# Patient Record
Sex: Female | Born: 1965 | Hispanic: Yes | Marital: Married | Smoking: Current some day smoker
Health system: Southern US, Community
[De-identification: ages and names within clinical notes are randomized; demographics above are authoritative.]

## PROBLEM LIST (undated history)

## (undated) DIAGNOSIS — R0789 Other chest pain: Secondary | ICD-10-CM

## (undated) DIAGNOSIS — I10 Essential (primary) hypertension: Secondary | ICD-10-CM

## (undated) DIAGNOSIS — R002 Palpitations: Secondary | ICD-10-CM

## (undated) HISTORY — DX: Other chest pain: R07.89

## (undated) HISTORY — DX: Palpitations: R00.2

## (undated) HISTORY — DX: Essential (primary) hypertension: I10

---

## 2015-07-25 ENCOUNTER — Telehealth: Payer: Self-pay | Admitting: Cardiovascular Disease

## 2015-07-25 NOTE — Telephone Encounter (Signed)
07/25/2015 Received referral packet from [facility] for upcoming appointment on 07/30/2015 with Dr. Allyson SabalBerry.  [facility]

## 2015-07-30 ENCOUNTER — Ambulatory Visit (INDEPENDENT_AMBULATORY_CARE_PROVIDER_SITE_OTHER): Payer: Self-pay | Admitting: Cardiovascular Disease

## 2015-07-30 ENCOUNTER — Encounter: Payer: Self-pay | Admitting: Cardiovascular Disease

## 2015-07-30 ENCOUNTER — Ambulatory Visit (INDEPENDENT_AMBULATORY_CARE_PROVIDER_SITE_OTHER): Payer: PRIVATE HEALTH INSURANCE

## 2015-07-30 DIAGNOSIS — R002 Palpitations: Secondary | ICD-10-CM | POA: Diagnosis not present

## 2015-07-30 DIAGNOSIS — R079 Chest pain, unspecified: Secondary | ICD-10-CM

## 2015-07-30 DIAGNOSIS — I10 Essential (primary) hypertension: Secondary | ICD-10-CM | POA: Insufficient documentation

## 2015-07-30 NOTE — Assessment & Plan Note (Signed)
History of new onset chest pain over the last 3-4 months. She's had 3-4 episodes lasting several minutes at a time. Her last episode was associated with left upper extremity radiation. Her risk factors include hypertension. I'm going to get an exercise Myoview stress test to risk stratify her.

## 2015-07-30 NOTE — Patient Instructions (Addendum)
Medication Instructions:  Your physician recommends that you continue on your current medications as directed. Please refer to the Current Medication list given to you today.   Labwork: none  Testing/Procedures: Your physician has requested that you have en exercise stress myoview. For further information please visit [email or web address]. Please follow instruction sheet, as given.  Your physician has recommended that you wear an event monitor. Event monitors are medical devices that record the heart's electrical activity. Doctors most often us these monitors to diagnose arrhythmias. Arrhythmias are problems with the speed or rhythm of the heartbeat. The monitor is a small, portable device. You can wear one while you do your normal daily activities. This is usually used to diagnose what is causing palpitations/syncope (passing out).   Follow-Up: Your physician recommends that you schedule a follow-up appointment in: 3-4 weeks with Dr. Allyson SabalBerry - after testing and monitor are complete    Any Other Special Instructions Will Be Listed Below (If Applicable).  If you have financial hardship (no insurance, self-pay, assistance card, orange card)..Patient needs to go to [email or web address] >>click patients tab >>click financial assistance >> complete/submit application     If you need a refill on your cardiac medications before your next appointment, please call your pharmacy.

## 2015-07-30 NOTE — Progress Notes (Signed)
     07/30/2015 Mackenzie Frost   07/10/1966  [id number]  Primary Physician Paulino RilyJONES,ENRICO G, MD Primary Cardiologist: Runell GessJonathan J. Ace Bergfeld MD Roseanne RenoFACP,FACC,FAHA, FSCAI   HPI:  Mrs. Mackenzie Frost is a 50 year old mildly overweight married female mother of 2 children and works as a Scientist, physiologicalreceptionist. She was referred by Dr. Knox RoyaltyEnrico Jones for cardiovascular evaluation because of new onset chest pain. She has a history of treated hypertension but otherwise no cardiovascular risk factors. She had the onset of chest pain 3-4 months ago has had several episodes lasting 2-3 minutes a time but recently with left upper extremity radiation. There was an episode that awakened her from sleep. There are no other associated symptoms other than palpitations at the time of chest pain.   Current Outpatient Prescriptions  Medication Sig Dispense Refill  . lisinopril (PRINIVIL,ZESTRIL) 20 MG tablet Take 20 mg by mouth daily.     No current facility-administered medications for this visit.    Allergies  Allergen Reactions  . Sulfate Hives    Social History   Social History  . Marital Status: Married    Spouse Name: N/A  . Number of Children: N/A  . Years of Education: N/A   Occupational History  . Not on file.   Social History Main Topics  . Smoking status: Current Some Day Smoker  . Smokeless tobacco: Not on file  . Alcohol Use: Yes  . Drug Use: No  . Sexual Activity: Not on file   Other Topics Concern  . Not on file   Social History Narrative  . No narrative on file     Review of Systems: General: negative for chills, fever, night sweats or weight changes.  Cardiovascular: negative for chest pain, dyspnea on exertion, edema, orthopnea, palpitations, paroxysmal nocturnal dyspnea or shortness of breath Dermatological: negative for rash Respiratory: negative for cough or wheezing Urologic: negative for hematuria Abdominal: negative for nausea, vomiting, diarrhea, bright red blood per rectum, melena, or  hematemesis Neurologic: negative for visual changes, syncope, or dizziness All other systems reviewed and are otherwise negative except as noted above.    Blood pressure 138/86, pulse 59, height 5\' 2"  (1.575 m), weight 138 lb (62.596 kg).  General appearance: alert and no distress Neck: no adenopathy, no carotid bruit, no JVD, supple, symmetrical, trachea midline and thyroid not enlarged, symmetric, no tenderness/mass/nodules Lungs: clear to auscultation bilaterally Heart: regular rate and rhythm, S1, S2 normal, no murmur, click, rub or gallop Extremities: extremities normal, atraumatic, no cyanosis or edema  EKG sinus bradycardia 59 without ST or T-wave changes. I personally reviewed this EKG  ASSESSMENT AND PLAN:   Palpitations Recent onset of palpitations occurring once or twice a month associated with chest pain. Her TSH was normal. We will check a 2 week event monitor.  Essential hypertension History of hypertension blood pressure measured at 138/86. She is on lisinopril 20 mg a day. Continue current meds are current  Chest pain History of new onset chest pain over the last 3-4 months. She's had 3-4 episodes lasting several minutes at a time. Her last episode was associated with left upper extremity radiation. Her risk factors include hypertension. I'm going to get an exercise Myoview stress test to risk stratify her.      Runell GessJonathan J. Kaimani Clayson MD FACP,FACC,FAHA, [facility] 07/30/2015 11:48 AM

## 2015-07-30 NOTE — Assessment & Plan Note (Signed)
History of hypertension blood pressure measured at 138/86. She is on lisinopril 20 mg a day. Continue current meds are current

## 2015-07-30 NOTE — Assessment & Plan Note (Signed)
Recent onset of palpitations occurring once or twice a month associated with chest pain. Her TSH was normal. We will check a 2 week event monitor.

## 2015-08-01 ENCOUNTER — Telehealth (HOSPITAL_COMMUNITY): Payer: Self-pay

## 2015-08-01 NOTE — Telephone Encounter (Signed)
Encounter complete. 

## 2015-08-05 ENCOUNTER — Ambulatory Visit (HOSPITAL_COMMUNITY)
Admission: RE | Admit: 2015-08-05 | Discharge: 2015-08-05 | Disposition: A | Discharge status: Home / Self Care | Payer: PRIVATE HEALTH INSURANCE | Source: Ambulatory Visit | Attending: Cardiology | Admitting: Cardiology

## 2015-08-05 DIAGNOSIS — R5383 Other fatigue: Secondary | ICD-10-CM | POA: Diagnosis not present

## 2015-08-05 DIAGNOSIS — F17200 Nicotine dependence, unspecified, uncomplicated: Secondary | ICD-10-CM | POA: Diagnosis not present

## 2015-08-05 DIAGNOSIS — R002 Palpitations: Secondary | ICD-10-CM | POA: Diagnosis not present

## 2015-08-05 DIAGNOSIS — I10 Essential (primary) hypertension: Secondary | ICD-10-CM | POA: Diagnosis not present

## 2015-08-05 DIAGNOSIS — R42 Dizziness and giddiness: Secondary | ICD-10-CM | POA: Insufficient documentation

## 2015-08-05 DIAGNOSIS — R079 Chest pain, unspecified: Secondary | ICD-10-CM | POA: Insufficient documentation

## 2015-08-05 DIAGNOSIS — Z8249 Family history of ischemic heart disease and other diseases of the circulatory system: Secondary | ICD-10-CM | POA: Insufficient documentation

## 2015-08-05 LAB — MYOCARDIAL PERFUSION IMAGING
CHL CUP MPHR: 169 {beats}/min
CHL CUP NUCLEAR SDS: 0
CHL CUP NUCLEAR SRS: 5
CHL CUP RESTING HR STRESS: 62 {beats}/min
CHL RATE OF PERCEIVED EXERTION: 17
CSEPED: 9 min
CSEPEDS: 0 s
CSEPEW: 10.1 METS
LV sys vol: 13 mL
LVDIAVOL: 51 mL
Peak HR: 160 {beats}/min
Percent HR: 94 %
SSS: 5
TID: 1.05

## 2015-08-05 MED ORDER — TECHNETIUM TC 99M SESTAMIBI GENERIC - CARDIOLITE
30.4000 | Freq: Once | INTRAVENOUS | Status: AC | PRN
  Administered 2015-08-05: 30.4 via INTRAVENOUS

## 2015-08-05 MED ORDER — TECHNETIUM TC 99M SESTAMIBI GENERIC - CARDIOLITE
10.6000 | Freq: Once | INTRAVENOUS | Status: AC | PRN
  Administered 2015-08-05: 10.6 via INTRAVENOUS

## 2015-08-27 ENCOUNTER — Ambulatory Visit: Payer: Self-pay | Admitting: Cardiovascular Disease

## 2015-09-10 ENCOUNTER — Ambulatory Visit: Payer: Self-pay | Admitting: Cardiovascular Disease

## 2015-10-15 ENCOUNTER — Ambulatory Visit (INDEPENDENT_AMBULATORY_CARE_PROVIDER_SITE_OTHER): Payer: PRIVATE HEALTH INSURANCE | Admitting: Cardiovascular Disease

## 2015-10-15 ENCOUNTER — Encounter: Payer: Self-pay | Admitting: Cardiovascular Disease

## 2015-10-15 VITALS — BP 134/78 | HR 64 | Ht 62.0 in | Wt 143.0 lb

## 2015-10-15 DIAGNOSIS — R002 Palpitations: Secondary | ICD-10-CM | POA: Diagnosis not present

## 2015-10-15 DIAGNOSIS — I10 Essential (primary) hypertension: Secondary | ICD-10-CM | POA: Diagnosis not present

## 2015-10-15 DIAGNOSIS — R079 Chest pain, unspecified: Secondary | ICD-10-CM | POA: Diagnosis not present

## 2015-10-15 NOTE — Patient Instructions (Signed)
Medication Instructions:  Your physician recommends that you continue on your current medications as directed. Please refer to the Current Medication list given to you today.   Labwork: none  Testing/Procedures: none  Follow-Up: Follow up with Dr. Berry as needed.   Any Other Special Instructions Will Be Listed Below (If Applicable).     If you need a refill on your cardiac medications before your next appointment, please call your pharmacy.   

## 2015-10-15 NOTE — Assessment & Plan Note (Signed)
Patient was completely palpitations and had an event monitor which showed only sinus rhythm. She's had no further symptoms.

## 2015-10-15 NOTE — Assessment & Plan Note (Signed)
History of hypertension blood pressure measured at 134/78. She is on lisinopril. Continue current meds at current dosing

## 2015-10-15 NOTE — Assessment & Plan Note (Signed)
The patient returns for follow-up of her outpatient Myoview stress test performed 08/18/15 for dilation of chest pain. Her stress test was low risk and nonischemic with breast attenuation artifact. She has had no further chest pain since I last saw her.

## 2015-10-15 NOTE — Progress Notes (Signed)
Mackenzie Frost returns for follow-up of her outpatient test performed to evaluate atypical chest pain and palpitations. Her Myoview stress test was low risk with rest attenuation artifact, and no ischemia. Her vent monitor showed only sinus rhythm. She's had no recurrent symptoms. I have reassured her regarding the favorable results and the likelihood that her symptoms were not ischemically mediated. I will see her back when necessary.

## 2015-10-27 ENCOUNTER — Other Ambulatory Visit: Payer: Self-pay | Admitting: Occupational Medicine

## 2015-10-27 ENCOUNTER — Ambulatory Visit: Payer: Self-pay

## 2015-10-27 DIAGNOSIS — Z0000 Encounter for general adult medical examination without abnormal findings: Secondary | ICD-10-CM

## 2017-01-29 ENCOUNTER — Emergency Department (HOSPITAL_COMMUNITY): Payer: Managed Care, Other (non HMO)

## 2017-01-29 ENCOUNTER — Encounter (HOSPITAL_COMMUNITY): Payer: Self-pay | Admitting: Emergency Medicine

## 2017-01-29 DIAGNOSIS — I10 Essential (primary) hypertension: Secondary | ICD-10-CM | POA: Diagnosis not present

## 2017-01-29 DIAGNOSIS — F17200 Nicotine dependence, unspecified, uncomplicated: Secondary | ICD-10-CM | POA: Diagnosis not present

## 2017-01-29 DIAGNOSIS — E860 Dehydration: Secondary | ICD-10-CM | POA: Insufficient documentation

## 2017-01-29 DIAGNOSIS — D649 Anemia, unspecified: Secondary | ICD-10-CM | POA: Diagnosis not present

## 2017-01-29 DIAGNOSIS — Z79899 Other long term (current) drug therapy: Secondary | ICD-10-CM | POA: Insufficient documentation

## 2017-01-29 DIAGNOSIS — R002 Palpitations: Secondary | ICD-10-CM | POA: Diagnosis present

## 2017-01-29 LAB — POCT I-STAT TROPONIN I: TROPONIN I, POC: 0 ng/mL (ref 0.00–0.08)

## 2017-01-29 NOTE — ED Triage Notes (Signed)
Patient states she is having chest pain. Chest pain started an hour ago while having intercourse. Patient states it feels like her heart is beating really fast.

## 2017-01-30 ENCOUNTER — Emergency Department (HOSPITAL_COMMUNITY)
Admission: EM | Admit: 2017-01-30 | Discharge: 2017-01-30 | Disposition: A | Discharge status: Home / Self Care | Payer: Managed Care, Other (non HMO) | Attending: Emergency Medicine | Admitting: Emergency Medicine

## 2017-01-30 DIAGNOSIS — E860 Dehydration: Secondary | ICD-10-CM

## 2017-01-30 DIAGNOSIS — D649 Anemia, unspecified: Secondary | ICD-10-CM

## 2017-01-30 LAB — CBC
HEMATOCRIT: 33 % — AB (ref 36.0–46.0)
Hemoglobin: 10.6 g/dL — ABNORMAL LOW (ref 12.0–15.0)
MCH: 25.9 pg — ABNORMAL LOW (ref 26.0–34.0)
MCHC: 32.1 g/dL (ref 30.0–36.0)
MCV: 80.7 fL (ref 78.0–100.0)
PLATELETS: 283 10*3/uL (ref 150–400)
RBC: 4.09 MIL/uL (ref 3.87–5.11)
RDW: 16.4 % — ABNORMAL HIGH (ref 11.5–15.5)
WBC: 7.4 10*3/uL (ref 4.0–10.5)

## 2017-01-30 LAB — POCT I-STAT TROPONIN I: Troponin i, poc: 0 ng/mL (ref 0.00–0.08)

## 2017-01-30 LAB — BASIC METABOLIC PANEL
Anion gap: 10 (ref 5–15)
BUN: 16 mg/dL (ref 6–20)
CHLORIDE: 108 mmol/L (ref 101–111)
CO2: 20 mmol/L — ABNORMAL LOW (ref 22–32)
CREATININE: 0.96 mg/dL (ref 0.44–1.00)
Calcium: 9.2 mg/dL (ref 8.9–10.3)
GFR calc Af Amer: >60 mL/min (ref >60)
GLUCOSE: 125 mg/dL — AB (ref 65–99)
Potassium: 3.5 mmol/L (ref 3.5–5.1)
SODIUM: 138 mmol/L (ref 135–145)

## 2017-01-30 MED ORDER — SODIUM CHLORIDE 0.9 % IV BOLUS (SEPSIS)
1000.0000 mL | Freq: Once | INTRAVENOUS | Status: AC
  Administered 2017-01-30: 1000 mL via INTRAVENOUS

## 2017-01-30 MED ORDER — FERROUS SULFATE 325 (65 FE) MG PO TABS: 325.0000 mg | ORAL_TABLET | Freq: Two times a day (BID) | ORAL | 0 refills | Status: DC

## 2017-01-30 NOTE — ED Provider Notes (Signed)
[facility]-EMERGENCY DEPT Provider Note   CSN: [id number] Arrival date & time: 01/29/17  2309     History   Chief Complaint Chief Complaint  Patient presents with  . Chest Pain    HPI Mackenzie Frost is a 51 y.o. female.  HPI 51 yo F with h/o palpitations here with palpitations, mild SOB. Pt states that she was having intercourse with her husband today when she began to feel like her heart was beating "really fast." It was regular but fast. She also felt more tired/SOB than usual. She admits to similar sx in the past and has worn a holter monitor, but was not told anything was abnormal. She does also admit to mild recent DOE, general fatigue, and feeling cold all the time. No vaginal bleeding. No dark red or black bowel movements. No ongoing palpitations, CP, or other sx. No current SOB. No syncope or lightheadedness.  Past Medical History:  Diagnosis Date  . Atypical chest pain   . Hypertension   . Palpitations     Patient Active Problem List   Diagnosis Date Noted  . Chest pain 07/30/2015  . Palpitations 07/30/2015  . Essential hypertension 07/30/2015    History reviewed. No pertinent surgical history.  OB History    No data available       Home Medications    Prior to Admission medications   Medication Sig Start Date End Date Taking? Authorizing Provider  loratadine (CLARITIN) 10 MG tablet Take 10 mg by mouth daily.   Yes [provider]  losartan (COZAAR) 50 MG tablet Take 50 mg by mouth daily.   Yes [provider]  ferrous sulfate 325 (65 FE) MG tablet Take 1 tablet (325 mg total) by mouth 2 (two) times daily with a meal. 01/30/17 03/01/17  Shaune Pollack, MD    Family History Family History  Problem Relation Age of Onset  . Cancer Mother   . Diabetes Mother   . Hypertension Father   . Heart failure Father     Social History Social History  Substance Use Topics  . Smoking status: Current Some Day Smoker  . Smokeless tobacco: Never Used  .  Alcohol use Yes     Allergies   Sulfate   Review of Systems Review of Systems  Constitutional: Positive for fatigue.  Respiratory: Positive for shortness of breath.   Cardiovascular: Positive for chest pain and palpitations.  Neurological: Positive for weakness and light-headedness.  All other systems reviewed and are negative.    Physical Exam Updated Vital Signs BP 128/82   Pulse 73   Temp 98.8 F (37.1 C) (Oral)   Resp 16   Ht 5\' 2"  (1.575 m)   Wt 68 kg (150 lb)   SpO2 98%   BMI 27.44 kg/m   Physical Exam  Constitutional: She is oriented to person, place, and time. She appears well-developed and well-nourished. No distress.  HENT:  Head: Normocephalic and atraumatic.  Eyes: Conjunctivae are normal.  Neck: Neck supple.  Cardiovascular: Normal rate, regular rhythm and normal heart sounds.  Exam reveals no friction rub.   No murmur heard. Pulmonary/Chest: Effort normal and breath sounds normal. No respiratory distress. She has no wheezes. She has no rales.  Abdominal: She exhibits no distension.  Musculoskeletal: She exhibits no edema.  Neurological: She is alert and oriented to person, place, and time. She exhibits normal muscle tone.  Skin: Skin is warm. Capillary refill takes less than 2 seconds.  Psychiatric: She has a normal mood  and affect.  Nursing note and vitals reviewed.    ED Treatments / Results  Labs (all labs ordered are listed, but only abnormal results are displayed) Labs Reviewed  BASIC METABOLIC PANEL - Abnormal; Notable for the following:       Result Value   CO2 20 (*)    Glucose, Bld 125 (*)    All other components within normal limits  CBC - Abnormal; Notable for the following:    Hemoglobin 10.6 (*)    HCT 33.0 (*)    MCH 25.9 (*)    RDW 16.4 (*)    All other components within normal limits  I-STAT TROPOININ, ED  POCT I-STAT TROPONIN I  I-STAT TROPOININ, ED  POCT I-STAT TROPONIN I    EKG  EKG  Interpretation  Date/Time:  Saturday January 29 2017 23:18:43 EDT Ventricular Rate:  89 PR Interval:    QRS Duration: 80 QT Interval:  371 QTC Calculation: 452 R Axis:   61 Text Interpretation:  Sinus rhythm Borderline T abnormalities, anterior leads No old tracing to compare Confirmed by Shaune PollackIsaacs, Rilyn Upshaw [phone]([id number]) on 01/30/2017 10:12:09 AM       Radiology Dg Chest 2 View  Result Date: 01/29/2017 CLINICAL DATA:  Acute onset of left-sided chest pain. Initial encounter. EXAM: CHEST  2 VIEW COMPARISON:  Chest radiograph performed 10/27/2015 FINDINGS: The lungs are well-aerated and clear. There is no evidence of focal opacification, pleural effusion or pneumothorax. The heart is normal in size; the mediastinal contour is within normal limits. No acute osseous abnormalities are seen. IMPRESSION: No acute cardiopulmonary process seen. Electronically Signed   By: Roanna RaiderJeffery  Chang M.D.   On: 01/29/2017 23:49    Procedures Procedures (including critical care time)  Medications Ordered in ED Medications  sodium chloride 0.9 % bolus 1,000 mL (0 mLs Intravenous Stopped 01/30/17 0518)     Initial Impression / Assessment and Plan / ED Course  I have reviewed the triage vital signs and the nursing notes.  Pertinent labs & imaging results that were available during my care of the patient were reviewed by me and considered in my medical decision making (see chart for details).     51 yo F here with mild symptomatic palpitations during exertion. Unclear etiology, but I suspect ths is 2/2 symptomatic tachycardia in setting of acute on chronic anemia. Hgb 10.6 here with borderline low MCV, likely 2/2 IDA. No evidence of ongoing GI or other blood loss. She also appears mildly dehydrated so was given fluids. Otherwise, EKG non-ischemic, HEART score <3, and delta trop neg - doubt ACS. Arrhythmai on DDx but intervals WNL, will refer for outpt follow-up. No other acute emergent pathology identified. Ambulatory here  without difficulty with no ectopy on tele.  Final Clinical Impressions(s) / ED Diagnoses   Final diagnoses:  Anemia, unspecified type  Dehydration    New Prescriptions Discharge Medication List as of 01/30/2017  4:47 AM    START taking these medications   Details  ferrous sulfate 325 (65 FE) MG tablet Take 1 tablet (325 mg total) by mouth 2 (two) times daily with a meal., Starting Sun 01/30/2017, Until Tue 03/01/2017, Print         Shaune PollackIsaacs, Tari Lecount, MD 01/31/17 1043

## 2017-03-22 ENCOUNTER — Other Ambulatory Visit: Payer: Self-pay | Admitting: Internal Medicine

## 2017-03-22 DIAGNOSIS — R945 Abnormal results of liver function studies: Secondary | ICD-10-CM

## 2017-03-22 DIAGNOSIS — I10 Essential (primary) hypertension: Secondary | ICD-10-CM

## 2017-03-22 DIAGNOSIS — D649 Anemia, unspecified: Secondary | ICD-10-CM

## 2017-03-22 DIAGNOSIS — R7989 Other specified abnormal findings of blood chemistry: Secondary | ICD-10-CM

## 2017-03-30 ENCOUNTER — Encounter (INDEPENDENT_AMBULATORY_CARE_PROVIDER_SITE_OTHER): Payer: Self-pay

## 2017-03-30 ENCOUNTER — Ambulatory Visit
Admission: RE | Admit: 2017-03-30 | Discharge: 2017-03-30 | Disposition: A | Discharge status: Home / Self Care | Payer: Managed Care, Other (non HMO) | Source: Ambulatory Visit | Attending: Internal Medicine | Admitting: Internal Medicine

## 2017-03-30 DIAGNOSIS — D649 Anemia, unspecified: Secondary | ICD-10-CM

## 2017-03-30 DIAGNOSIS — R945 Abnormal results of liver function studies: Secondary | ICD-10-CM

## 2017-03-30 DIAGNOSIS — R7989 Other specified abnormal findings of blood chemistry: Secondary | ICD-10-CM

## 2017-03-30 DIAGNOSIS — I10 Essential (primary) hypertension: Secondary | ICD-10-CM

## 2017-10-22 IMAGING — US US ABDOMEN COMPLETE
1 series · 14 of 25 positions shown · non-contrast
Comparison: None.

CLINICAL DATA: Abnormal LFTs

EXAM:
ABDOMEN ULTRASOUND COMPLETE

[Series 1: us abdomen complete · 0.21mm/px · 14 of 79 slices shown]
[im 1/79]
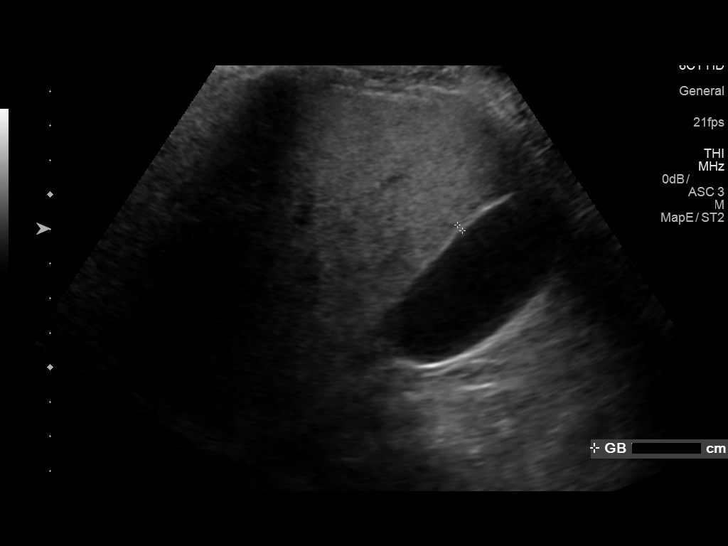
[im 7/79]
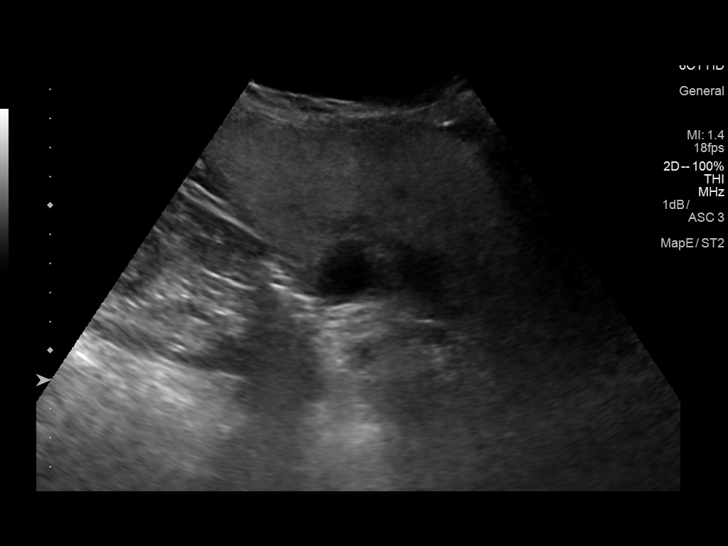
[im 14/79]
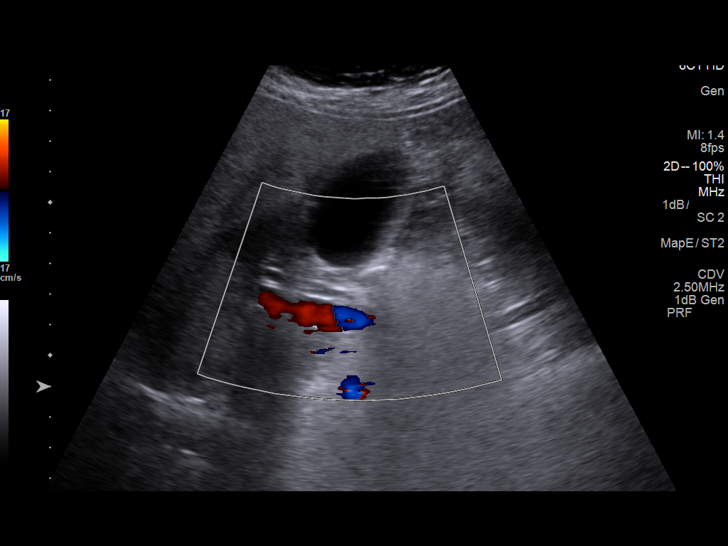
[im 20/79]
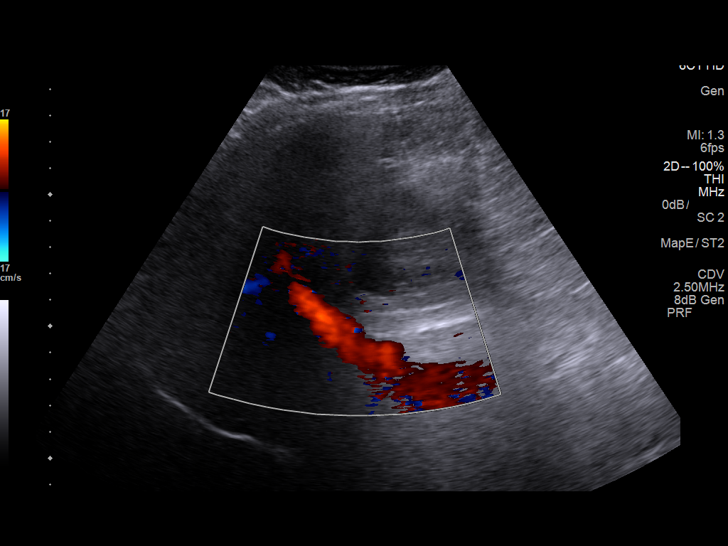
[im 27/79]
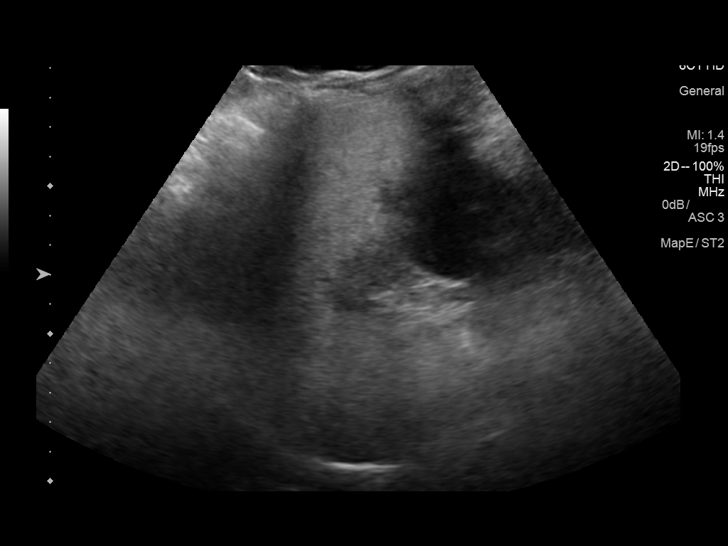
[im 30/79]
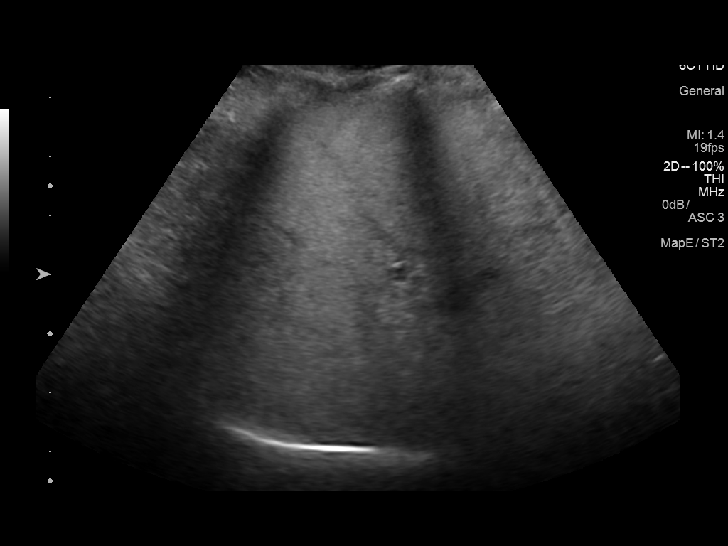
[im 36/79]
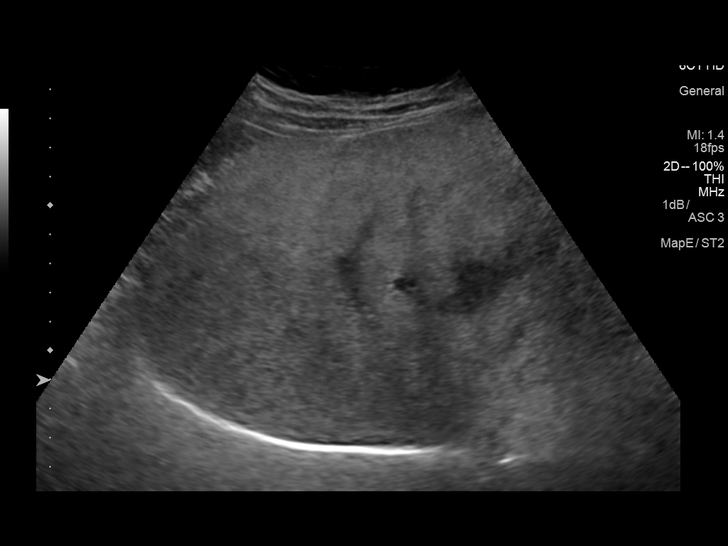
[im 43/79]
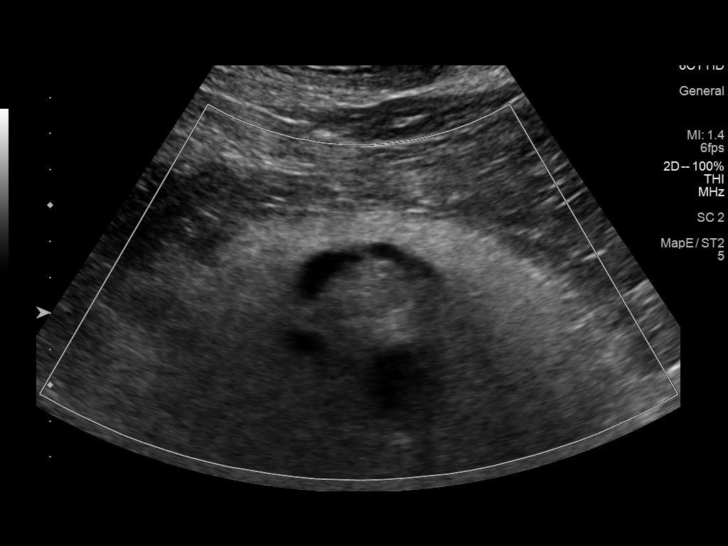
[im 49/79]
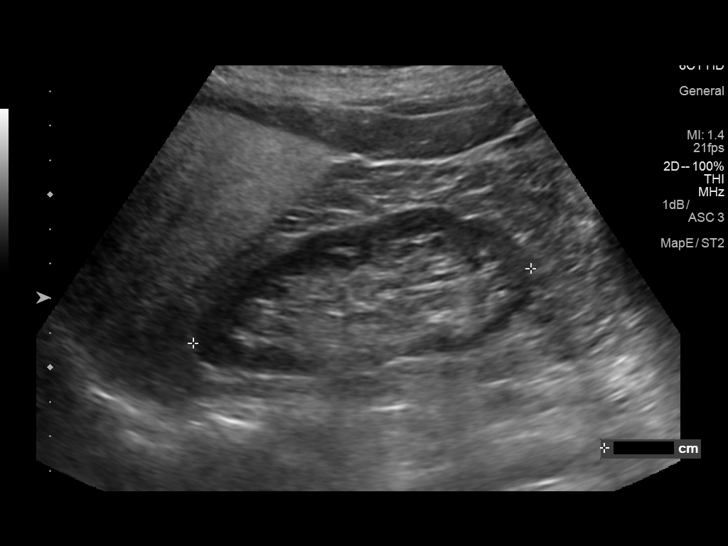
[im 53/79]
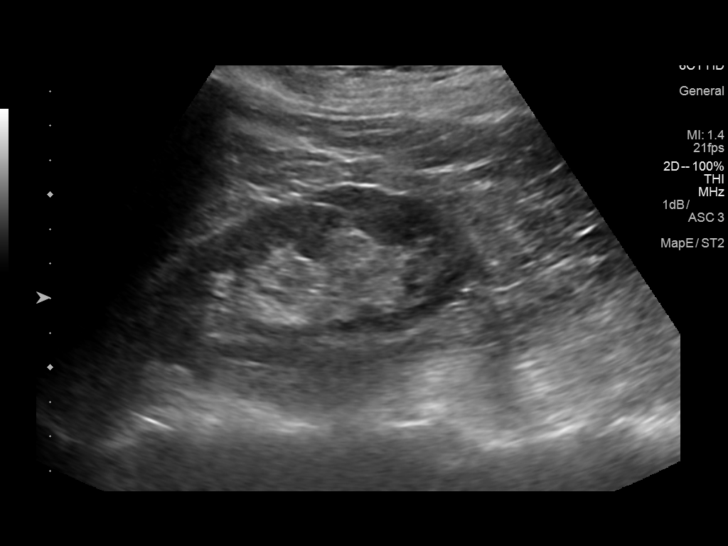
[im 59/79]
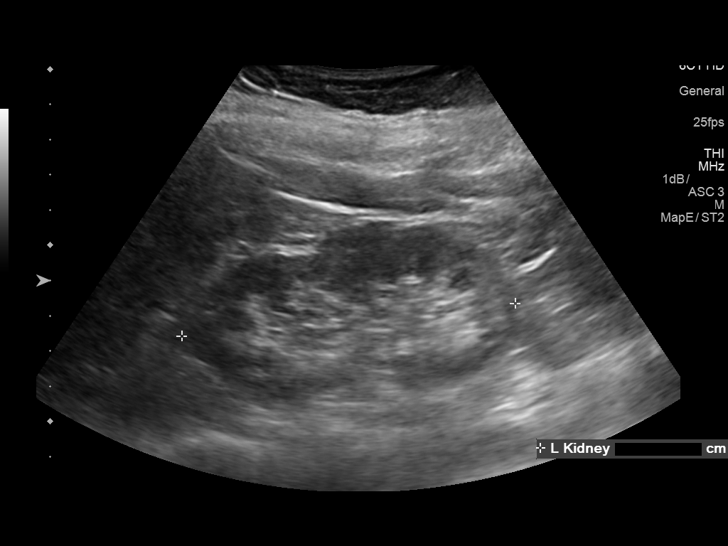
[im 66/79]
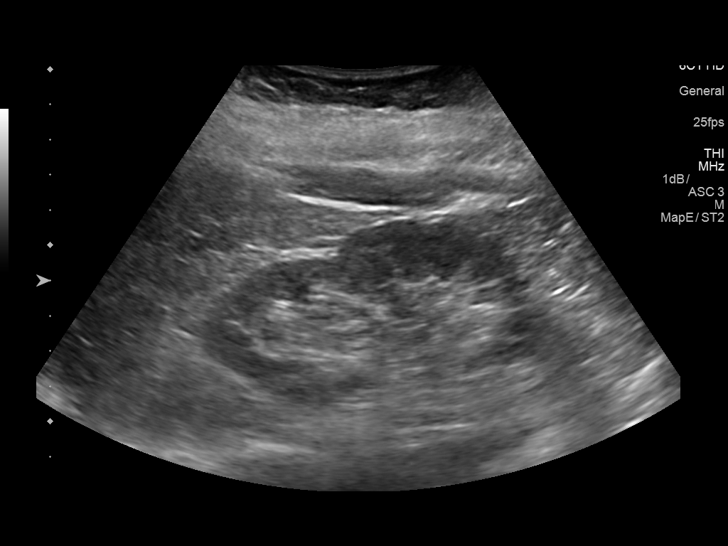
[im 72/79]
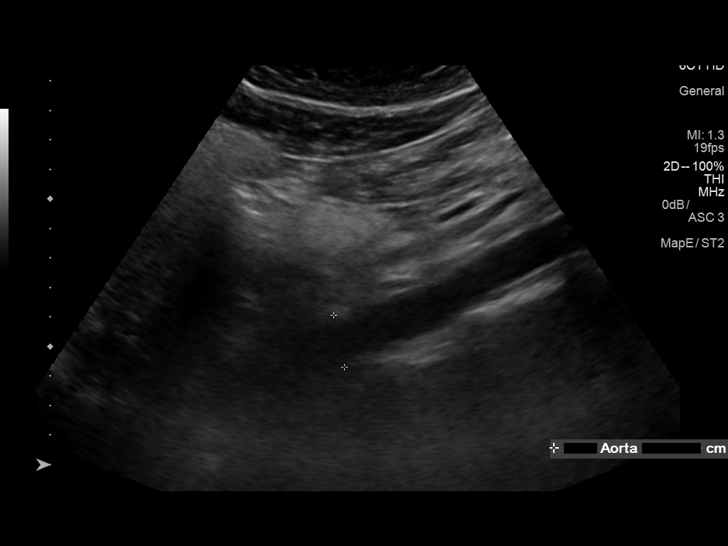
[im 79/79]
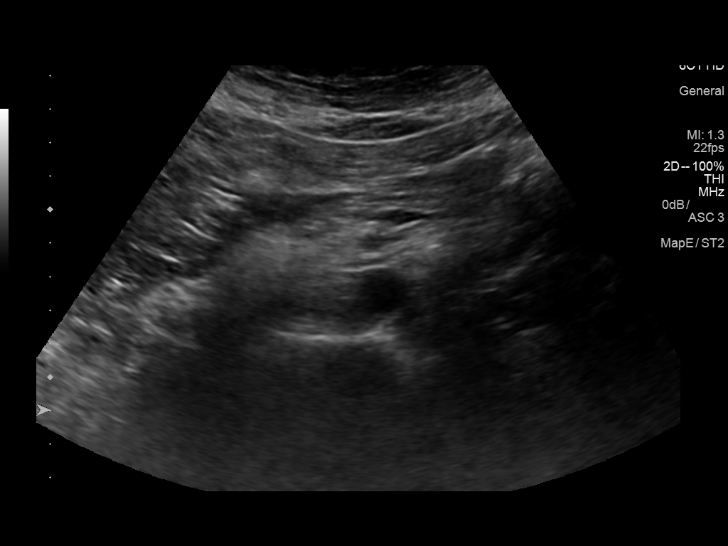

[14 of 25 positions shown; findings below may reference images not displayed]

FINDINGS: Gallbladder: No gallstones or wall thickening visualized. No
sonographic Murphy sign noted by sonographer.

Common bile duct: Diameter: Normal caliber, 3 mm

Liver: Increased echotexture compatible with fatty infiltration. No
focal abnormality or biliary ductal dilatation. Area of focal fatty
sparing adjacent to the porta hepatis. Portal vein is patent on
color Doppler imaging with normal direction of blood flow towards
the liver.

IVC: No abnormality visualized.

Pancreas: Visualized portion unremarkable.

Spleen: Size and appearance within normal limits.

Right Kidney: Length: 10.0 cm. Mild cortical thinning. Echogenicity
within normal limits. No mass or hydronephrosis visualized.

Left Kidney: Length: 10.1 cm. Mild cortical thinning. Echogenicity
within normal limits. No mass or hydronephrosis visualized.

Abdominal aorta: No aneurysm visualized.

Other findings: None.
IMPRESSION: Fatty infiltration of the liver with focal fatty sparing adjacent to
the porta hepatis. No focal hepatic abnormality.

## 2018-02-15 DIAGNOSIS — E559 Vitamin D deficiency, unspecified: Secondary | ICD-10-CM | POA: Insufficient documentation

## 2018-02-15 DIAGNOSIS — Z862 Personal history of diseases of the blood and blood-forming organs and certain disorders involving the immune mechanism: Secondary | ICD-10-CM | POA: Insufficient documentation

## 2018-07-26 DIAGNOSIS — J310 Chronic rhinitis: Secondary | ICD-10-CM | POA: Insufficient documentation

## 2018-08-16 DIAGNOSIS — R1012 Left upper quadrant pain: Secondary | ICD-10-CM | POA: Insufficient documentation

## 2018-08-16 DIAGNOSIS — R7989 Other specified abnormal findings of blood chemistry: Secondary | ICD-10-CM | POA: Insufficient documentation

## 2018-08-16 DIAGNOSIS — K5900 Constipation, unspecified: Secondary | ICD-10-CM | POA: Insufficient documentation

## 2018-08-16 DIAGNOSIS — Z800 Family history of malignant neoplasm of digestive organs: Secondary | ICD-10-CM | POA: Insufficient documentation

## 2018-12-01 DIAGNOSIS — B9681 Helicobacter pylori [H. pylori] as the cause of diseases classified elsewhere: Secondary | ICD-10-CM | POA: Insufficient documentation

## 2018-12-01 DIAGNOSIS — K2970 Gastritis, unspecified, without bleeding: Secondary | ICD-10-CM | POA: Insufficient documentation

## 2018-12-21 ENCOUNTER — Encounter: Payer: Self-pay | Admitting: Podiatry

## 2018-12-21 ENCOUNTER — Ambulatory Visit: Payer: Managed Care, Other (non HMO) | Admitting: Podiatry

## 2018-12-21 ENCOUNTER — Other Ambulatory Visit: Payer: Self-pay

## 2018-12-21 ENCOUNTER — Other Ambulatory Visit: Payer: Self-pay | Admitting: Podiatry

## 2018-12-21 ENCOUNTER — Ambulatory Visit (INDEPENDENT_AMBULATORY_CARE_PROVIDER_SITE_OTHER): Payer: Managed Care, Other (non HMO)

## 2018-12-21 VITALS — BP 122/72 | HR 61 | Temp 97.9°F | Resp 16

## 2018-12-21 DIAGNOSIS — M79672 Pain in left foot: Secondary | ICD-10-CM

## 2018-12-21 DIAGNOSIS — M722 Plantar fascial fibromatosis: Secondary | ICD-10-CM

## 2018-12-21 DIAGNOSIS — M79671 Pain in right foot: Secondary | ICD-10-CM

## 2018-12-21 MED ORDER — TRIAMCINOLONE ACETONIDE 10 MG/ML IJ SUSP
10.0000 mg | Freq: Once | INTRAMUSCULAR | Status: AC
  Administered 2018-12-21: 10 mg

## 2018-12-21 NOTE — Progress Notes (Signed)
Subjective:   Patient ID: Mackenzie Frost, female   DOB: 53 y.o.   MRN: [id number]   HPI Patient presents stating a lot of pain in the arch of both feet that is been going on for around a month.  States that she has been trying to get more active and that the pain seems worse with increased activity.  Patient smokes occasionally and is active   Review of Systems  All other systems reviewed and are negative.       Objective:  Physical Exam Vitals signs and nursing note reviewed.  Constitutional:      Appearance: She is well-developed.  Pulmonary:     Effort: Pulmonary effort is normal.  Musculoskeletal: Normal range of motion.  Skin:    General: Skin is warm.  Neurological:     Mental Status: She is alert.     Neurovascular status found to be intact muscle strength is adequate range of motion within normal limits.  Patient is noted to have exquisite discomfort in the mid arch area bilateral with inflammation fluid of the fascia and moderate tightness of the fascia and is also noted to have moderate flatfoot deformity bilateral.  Good digital perfusion and well oriented x3      Assessment:  Mid arch fasciitis bilateral with moderate depression of the arch     Plan:  H&P conditions reviewed and today I did sterile prep and did careful mid arch injection bilateral 3 mg Kenalog 5 mg Xylocaine and applied fascial brace bilateral with instructions on usage.  Patient will do home physical therapy wear support shoes and be seen back in 3 weeks  X-rays indicate that there is moderate depression of the arch with no indications of stress fracture or advanced arthritis

## 2018-12-21 NOTE — Patient Instructions (Signed)

## 2018-12-21 NOTE — Progress Notes (Signed)
   Subjective:    Patient ID: Mackenzie Frost, female    DOB: [birth year], 53 y.o.   MRN: [id number]  HPI    Review of Systems  All other systems reviewed and are negative.      Objective:   Physical Exam        Assessment & Plan:

## 2019-01-03 ENCOUNTER — Other Ambulatory Visit: Payer: Self-pay | Admitting: Podiatry

## 2019-01-03 ENCOUNTER — Ambulatory Visit: Payer: Managed Care, Other (non HMO) | Admitting: Podiatry

## 2019-01-03 ENCOUNTER — Ambulatory Visit (INDEPENDENT_AMBULATORY_CARE_PROVIDER_SITE_OTHER): Payer: Managed Care, Other (non HMO)

## 2019-01-03 ENCOUNTER — Other Ambulatory Visit: Payer: Self-pay

## 2019-01-03 VITALS — Temp 97.3°F

## 2019-01-03 DIAGNOSIS — S99921A Unspecified injury of right foot, initial encounter: Secondary | ICD-10-CM

## 2019-01-03 DIAGNOSIS — M79671 Pain in right foot: Secondary | ICD-10-CM

## 2019-01-03 DIAGNOSIS — S92424A Nondisplaced fracture of distal phalanx of right great toe, initial encounter for closed fracture: Secondary | ICD-10-CM | POA: Diagnosis not present

## 2019-01-03 DIAGNOSIS — S92404A Nondisplaced unspecified fracture of right great toe, initial encounter for closed fracture: Secondary | ICD-10-CM

## 2019-01-03 MED ORDER — DICLOFENAC SODIUM 1 % TD GEL: 2.0000 g | Freq: Four times a day (QID) | TRANSDERMAL | 2 refills | Status: DC

## 2019-01-03 NOTE — Progress Notes (Signed)
Subjective: 53 year old female presents the office with concerns of pain to her right big toe after she had on the staircase last night.  She states that she had some swelling and bruising to the area and the area is painful mostly along the big toe. She is had no recent treatment.  No other injury. Denies any systemic complaints such as fevers, chills, nausea, vomiting. No acute changes since last appointment, and no other complaints at this time.   Objective: AAO x3, NAD DP/PT pulses palpable bilaterally, CRT less than 3 seconds There is tenderness palpation to the right hallux along the dorsal aspect of proximal phalanx but also to the distal phalanx more to the medial aspect.  There is mild bruising to the area as well as swelling. Incurvation present the toenail which is been chronic but there is no drainage or pus or any pain to the actual toenail. No open lesions or pre-ulcerative lesions.  No pain with calf compression, swelling, warmth, erythema  Assessment: Right hallux injury, small avulsion fracture medial distal phalanx  Plan: -All treatment options discussed with the patient including all alternatives, risks, complications.  -X-rays were obtained reviewed.  Compared to prior x-rays on the medial aspect of the distal phalanx base small chip fracture present.  There is a radiodensity present along the hallux as well with this was present previously. -Due to the discomfort place her into a surgical shoe today for mobilization. As she starts to feel better she can transition to a regular shoe but discussed a stiffer soled shoe. -Patient encouraged to call the office with any questions, concerns, change in symptoms.   She has a follow-up with Dr. Paulla Dolly and we will keep his appointment but encouraged to call any questions or concerns meantime.  Trula Slade DPM

## 2019-01-11 ENCOUNTER — Ambulatory Visit: Payer: Managed Care, Other (non HMO) | Admitting: Podiatry

## 2019-01-18 ENCOUNTER — Encounter: Payer: Self-pay | Admitting: Podiatry

## 2019-01-18 ENCOUNTER — Ambulatory Visit: Payer: Managed Care, Other (non HMO) | Admitting: Podiatry

## 2019-01-18 ENCOUNTER — Other Ambulatory Visit: Payer: Self-pay

## 2019-01-18 DIAGNOSIS — M722 Plantar fascial fibromatosis: Secondary | ICD-10-CM

## 2019-01-18 DIAGNOSIS — S92424A Nondisplaced fracture of distal phalanx of right great toe, initial encounter for closed fracture: Secondary | ICD-10-CM | POA: Diagnosis not present

## 2019-01-21 NOTE — Progress Notes (Signed)
Subjective:   Patient ID: Mackenzie Frost, female   DOB: 53 y.o.   MRN: [id number]   HPI Patient states it seems to have improved with only mild discomfort currently   ROS      Objective:  Physical Exam  Neurovascular status intact with patient's right hallux improved with slight pain still noted only upon deep palpation     Assessment:  Improving from fracture distal phalanx right     Plan:  Continue being careful with this along with wider shoes and patient's discharge will be seen back as needed

## 2019-08-23 ENCOUNTER — Encounter: Payer: Self-pay | Admitting: Neurology

## 2019-08-23 ENCOUNTER — Ambulatory Visit: Payer: Managed Care, Other (non HMO) | Admitting: Neurology

## 2019-08-23 ENCOUNTER — Other Ambulatory Visit: Payer: Self-pay

## 2019-08-23 VITALS — BP 127/79 | HR 60 | Temp 96.4°F | Ht 62.0 in | Wt 145.0 lb

## 2019-08-23 DIAGNOSIS — R258 Other abnormal involuntary movements: Secondary | ICD-10-CM

## 2019-08-23 DIAGNOSIS — R0683 Snoring: Secondary | ICD-10-CM

## 2019-08-23 DIAGNOSIS — R05 Cough: Secondary | ICD-10-CM

## 2019-08-23 DIAGNOSIS — R002 Palpitations: Secondary | ICD-10-CM

## 2019-08-23 DIAGNOSIS — R058 Other specified cough: Secondary | ICD-10-CM

## 2019-08-23 DIAGNOSIS — R519 Headache, unspecified: Secondary | ICD-10-CM | POA: Diagnosis not present

## 2019-08-23 DIAGNOSIS — E663 Overweight: Secondary | ICD-10-CM | POA: Diagnosis not present

## 2019-08-23 DIAGNOSIS — R0689 Other abnormalities of breathing: Secondary | ICD-10-CM | POA: Diagnosis not present

## 2019-08-23 NOTE — Patient Instructions (Signed)

## 2019-08-23 NOTE — Progress Notes (Signed)
Subjective:    Patient ID: Mackenzie Frost is a 54 y.o. female.  HPI     Mackenzie Foley, MD, Mackenzie Frost [facility] [address], [address] [address] [place], [place] [address]  Dear Dr. Thad Ranger, I saw your patient, Mackenzie Frost, upon your kind request in my sleep clinic today for initial consultation of her sleep disorder, in particular, concern for underlying obstructive sleep apnea.  The patient is unaccompanied today.  As you know, Mackenzie Frost is a 54 year old right-handed woman with an underlying medical history of hypertension, palpitations, atypical chest pain, mildly overweight state, who reports snoring and some nonrestorative sleep, has woken up with a sense of gasping for air at times, has recurrent headaches, some signs of teeth grinding.  I reviewed your office note records.  She has a history of bruxism.  Her Epworth sleepiness score is 3 out of 24, fatigue severity score is 10 out of 63.  She has woken up with a sense of gasping and in a panic.  She has had palpitations.  She has no family history of OSA.  She lives with her husband, she has 2 grown children, she moved from [place] some 5 years ago.  She works at [employer], works in data entry.  Bedtime is generally around 9, tries to be asleep before 10 and rise time is around 5, she typically is at work by 630.  She is a non-smoker and does not drink alcohol on a regular basis, drinks caffeine and limitation, typically only 1 cup/day, may be at times a second 1.  She denies any telltale symptoms of restless leg syndrome but has been told by her husband that she moves her feet involuntarily while asleep.  She does not have night to night nocturia.  Her Past Medical History Is Significant For: Past Medical History:  Diagnosis Date  . Atypical chest pain   . Hypertension   . Palpitations     Her Past Surgical History Is Significant For: Past Surgical History:  Procedure Laterality Date  . CESAREAN SECTION      x2    Her Family History Is Significant For: Family History  Problem Relation Age of Onset  . Cancer Mother   . Diabetes Mother   . Hypertension Father   . Heart failure Father     Her Social History Is Significant For: Social History   Socioeconomic History  . Marital status: Married    Spouse name: Not on file  . Number of children: Not on file  . Years of education: Not on file  . Highest education level: Not on file  Occupational History  . Not on file  Tobacco Use  . Smoking status: Current Some Day Smoker  . Smokeless tobacco: Never Used  Substance and Sexual Activity  . Alcohol use: Yes    Comment: 3-4/ month   . Drug use: No  . Sexual activity: Not on file  Other Topics Concern  . Not on file  Social History Narrative  . Not on file   Social Determinants of Health   Financial Resource Strain:   . Difficulty of Paying Living Expenses: Not on file  Food Insecurity:   . Worried About Programme researcher, broadcasting/film/video in the Last Year: Not on file  . Ran Out of Food in the Last Year: Not on file  Transportation Needs:   . Lack of Transportation (Medical): Not on file  . Lack of Transportation (Non-Medical): Not on file  Physical  Activity:   . Days of Exercise per Week: Not on file  . Minutes of Exercise per Session: Not on file  Stress:   . Feeling of Stress : Not on file  Social Connections:   . Frequency of Communication with Friends and Family: Not on file  . Frequency of Social Gatherings with Friends and Family: Not on file  . Attends Religious Services: Not on file  . Active Member of Clubs or Organizations: Not on file  . Attends Archivist Meetings: Not on file  . Marital Status: Not on file    Her Allergies Are:  Allergies  Allergen Reactions  . Sulfate Hives  :   Her Current Medications Are:  Outpatient Encounter Medications as of 08/23/2019  Medication Sig  . Cholecalciferol (VITAMIN D3) 50 MCG (2000 UT) capsule Take by mouth.  Marland Kitchen  lisinopril (ZESTRIL) 20 MG tablet Take by mouth.  . [DISCONTINUED] acyclovir (ZOVIRAX) 400 MG tablet Take by mouth.  . [DISCONTINUED] Albuterol Sulfate (PROAIR RESPICLICK) 585 (90 Base) MCG/ACT AEPB Inhale into the lungs.  . [DISCONTINUED] diclofenac sodium (VOLTAREN) 1 % GEL Apply 2 g topically 4 (four) times daily. Rub into affected area of foot 2 to 4 times daily   No facility-administered encounter medications on file as of 08/23/2019.  :  Review of Systems:  Out of a complete 14 point review of systems, all are reviewed and negative with the exception of these symptoms as listed below: Review of Systems  Neurological:       Here for sleep consult. Pt reports prior sleep study 8 years ago, at that time cpap was not recommended.  Epworth Sleepiness Scale 0= would never doze 1= slight chance of dozing 2= moderate chance of dozing 3= high chance of dozing  Sitting and reading:0 Watching TV:2 Sitting inactive in a public place (ex. Theater or meeting):0 As a passenger in a car for an hour without a break:0 Lying down to rest in the afternoon:1 Sitting and talking to someone:0 Sitting quietly after lunch (no alcohol):0 In a car, while stopped in traffic:0 Total:3     Objective:  Neurological Exam  Physical Exam Physical Examination:   Vitals:   08/23/19 1528  BP: 127/79  Pulse: 60  Temp: (!) 96.4 F (35.8 C)    General Examination: The patient is a very pleasant 54 y.o. female in no acute distress. She appears well-developed and well-nourished and well groomed.   HEENT: Normocephalic, atraumatic, pupils are equal, round and reactive to light, extraocular tracking is good without limitation to gaze excursion or nystagmus noted. Hearing is grossly intact. Face is symmetric with normal facial animation. Speech is clear with no dysarthria noted. There is no hypophonia. There is no lip, neck/head, jaw or voice tremor. Neck is supple with full range of passive and active  motion. There are no carotid bruits on auscultation. Oropharynx exam reveals: mild mouth dryness, good dental hygiene and mild airway crowding, due to Smaller airway entry, tonsils are small, Mallampati class I, neck circumference 14 inches.  She has a moderate overbite.  Tongue protrudes centrally in palate elevates symmetrically.  Chest: Clear to auscultation without wheezing, rhonchi or crackles noted.  Heart: S1+S2+0, regular and normal without murmurs, rubs or gallops noted.   Abdomen: Soft, non-tender and non-distended with normal bowel sounds appreciated on auscultation.  Extremities: There is no pitting edema in the distal lower extremities bilaterally.   Skin: Warm and dry without trophic changes noted.   Musculoskeletal: exam reveals  no obvious joint deformities, tenderness or joint swelling or erythema.   Neurologically:  Mental status: The patient is awake, alert and oriented in all 4 spheres. Her immediate and remote memory, attention, language skills and fund of knowledge are appropriate. There is no evidence of aphasia, agnosia, apraxia or anomia. Speech is clear with normal prosody and enunciation. Thought process is linear. Mood is normal and affect is normal.  Cranial nerves II - XII are as described above under HEENT exam.  Motor exam: Normal bulk, strength and tone is noted. There is no tremor, Romberg is negative. Fine motor skills and coordination: grossly intact.  Cerebellar testing: No dysmetria or intention tremor. There is no truncal or gait ataxia.  Sensory exam: intact to light touch in the upper and lower extremities.  Gait, station and balance: She stands easily. No veering to one side is noted. No leaning to one side is noted. Posture is age-appropriate and stance is narrow based. Gait shows normal stride length and normal pace. No problems turning are noted. Tandem walk is unremarkable.                Assessment and Plan:   In summary, Mackenzie Frost is a very  pleasant 54 y.o.-year old female with an underlying medical history of hypertension, palpitations, atypical chest pain, mildly overweight state, whose history and physical exam are concerning for obstructive sleep apnea (OSA). I had a long chat with the patient about my findings and the diagnosis of OSA, its prognosis and treatment options. We talked about medical treatments, surgical interventions and non-pharmacological approaches. I explained in particular the risks and ramifications of untreated moderate to severe OSA, especially with respect to developing cardiovascular disease down the Road, including congestive heart failure, difficult to treat hypertension, cardiac arrhythmias, or stroke. Even type 2 diabetes has, in part, been linked to untreated OSA. Symptoms of untreated OSA include daytime sleepiness, memory problems, mood irritability and mood disorder such as depression and anxiety, lack of energy, as well as recurrent headaches, especially morning headaches. We talked about trying to maintain a healthy lifestyle in general, as well as the importance of weight control. We also talked about the importance of good sleep hygiene. I recommended the following at this time: sleep study.   I explained the sleep test procedure to the patient and also outlined possible surgical and non-surgical treatment options of OSA, including the use of a custom-made dental device (which would require a referral to a specialist dentist or oral surgeon), upper airway surgical options, such as traditional UPPP or a novel less invasive surgical option in the form of Inspire hypoglossal nerve stimulation (which would involve a referral to an ENT surgeon). I also explained the CPAP treatment option to the patient, who indicated that she would be willing to try CPAP if the need arises. I explained the importance of being compliant with PAP treatment, not only for insurance purposes but primarily to improve Her symptoms, and  for the patient's long term health benefit, including to reduce Her cardiovascular risks. I answered all her questions today and the patient was in agreement. I plan to see her back after the sleep study is completed and encouraged her to call with any interim questions, concerns, problems or updates.   Thank you very much for allowing me to participate in the care of this nice patient. If I can be of any further assistance to you please do not hesitate to call me at [phone].  Sincerely,  Star Age, MD, Mackenzie Frost

## 2019-08-27 ENCOUNTER — Telehealth: Payer: Self-pay

## 2019-08-27 NOTE — Telephone Encounter (Signed)
-----   Message from Huston Foley, MD sent at 08/23/2019  5:25 PM EST ----- Can you fax my note to Dr. Carola Rhine, orthodontist? Mackenzie Frost

## 2019-08-27 NOTE — Telephone Encounter (Signed)
Mackenzie Frost, could you assist with this? Thanks!

## 2019-08-27 NOTE — Telephone Encounter (Signed)
Pt office note e-mail to [email or web address]

## 2019-09-10 ENCOUNTER — Ambulatory Visit (INDEPENDENT_AMBULATORY_CARE_PROVIDER_SITE_OTHER): Payer: Managed Care, Other (non HMO) | Admitting: Neurology

## 2019-09-10 ENCOUNTER — Other Ambulatory Visit: Payer: Self-pay

## 2019-09-10 DIAGNOSIS — G4733 Obstructive sleep apnea (adult) (pediatric): Secondary | ICD-10-CM | POA: Diagnosis not present

## 2019-09-10 DIAGNOSIS — R0683 Snoring: Secondary | ICD-10-CM

## 2019-09-10 DIAGNOSIS — R258 Other abnormal involuntary movements: Secondary | ICD-10-CM

## 2019-09-10 DIAGNOSIS — R058 Other specified cough: Secondary | ICD-10-CM

## 2019-09-10 DIAGNOSIS — R05 Cough: Secondary | ICD-10-CM

## 2019-09-10 DIAGNOSIS — R0689 Other abnormalities of breathing: Secondary | ICD-10-CM

## 2019-09-10 DIAGNOSIS — R002 Palpitations: Secondary | ICD-10-CM

## 2019-09-10 DIAGNOSIS — E663 Overweight: Secondary | ICD-10-CM

## 2019-09-10 DIAGNOSIS — R519 Headache, unspecified: Secondary | ICD-10-CM

## 2019-09-12 NOTE — Progress Notes (Signed)
Patient referred by Dr. Thad Ranger with orthodontics, seen by me on 08/23/19, HST on 09/10/19.    Please call and notify the patient that the recent home sleep test showed obstructive sleep apnea. OSA is overall mild, but would be worth treating to see if she feels better after treatment. She is encouraged to discuss an oral appliance option with Dr. Thad Ranger. If she would like to pursue treatment with autoPAP, I would be happy to prescribe an autoPAP machine for home use, through a DME company (of her choice, or as per insurance requirement). The DME representative will educate her on how to use the machine, how to put the mask on, etc. I have not put an order in yet, please let me know, how  she would like to proceed. Alternative treatment could be weight loss and avoiding the supine sleep position or a dental device, like I said. Again, let me know.   Huston Foley, MD, PhD [facility])

## 2019-09-12 NOTE — Procedures (Signed)
Patient Information     First Name: Mackenzie Last Name: Frost ID: [id number]  Birth Date: 1966-02-28 Age: 54 Gender: Female  Referring Provider: Dr. Thad Ranger BMI: 26.8 (W=145 lb, H=5' 2'')  Neck Circ.:  14 '' Epworth:  3/24   Sleep Study Information    Study Date: Sep 10, 2019 S/H/A Version: [id number] / 4.1.1528 / 53  History:    54 year old woman with a history of hypertension, palpitations, atypical chest pain, mildly overweight state, who reports snoring and some nonrestorative sleep, has woken up with a sense of gasping for air at times, has recurrent headaches, some signs of teeth grinding.  Summary & Diagnosis:     Mild OSA  Recommendations:     This HST shows mild obstructive sleep apnea, with an AHI of 7.1/hour and O2 nadir in the high 80s (the computer analyzed nadir of 76% could be an error upon my review). Treatment with positive airway pressure can be considered with autoPAP, if desired by patient. Treatment options otherwise include weight loss and avoidance of the supine sleep position or a dental device. She can discuss a dental device option with the referring orthodontist. These different avenues will be discussed with the patient. The patient will be seen in follow up in sleep clinic, if necessary. Please note, that other causes of the patient's symptoms, including circadian rhythm disturbances, an underlying mood disorder, medication effect and/or an underlying medical problem cannot be ruled out based on this test. Clinical correlation is recommended. The patient should be cautioned not to drive, work at heights, or operate dangerous or heavy equipment when tired or sleepy. Review and reiteration of good sleep hygiene measures should be pursued with any patient. The referring provider will be notified of the test results.   I certify that I have reviewed the raw data recording prior to the issuance of this report in accordance with the standards of the American Academy of Sleep  Medicine (AASM).  Huston Foley, MD, PhD  [facility]) Diplomat, American Board of Psychiatry and Neurology Diplomat, American Board of Sleep Medicine            Sleep Summary  Oxygen Saturation Statistics   Start Study Time: End Study Time: Total Recording Time:  8:39:02 PM 5:00:41 AM   8 h, 21 min  Total Sleep Time % REM of Sleep Time:  7 h, 8 min  23.8    Mean: 95 Minimum: 76 Maximum: 99  Mean of Desaturations Nadirs (%):   90  Oxygen Desaturation. %:   4-9 10-20 >20 Total  Events Number Total    16  3 84.2 15.8  0 0.0  19 100.0  Oxygen Saturation: <90 <=88 <85 <80 <70  Duration (minutes): Sleep % 0.1 0.0  0.1 0.0  0.0 0.0 0.0 0.0 0.0 0.0     Respiratory Indices      Total Events REM NREM All Night  pRDI:  72  pAHI:  50 ODI:  19  pAHIc:  6  % CSR: 0.0 14.3 8.9 3.6 1.4 8.9 6.5 2.4 0.9 10.2 7.1 2.7 1.0       Pulse Rate Statistics during Sleep (BPM)      Mean: 62 Minimum: 51 Maximum:  85    Indices are calculated using technically valid sleep time of 7 h, 5 min. Central-Indices are calculated using technically valid sleep time of 6  h, 4 min. pRDI/pAHI are calculated using oxi desaturations ? 3%  Body Position Statistics  Position Supine Prone Right Left Non-Supine  Sleep (min) 159.5 65.0 0.0 203.7 268.7  Sleep % 37.3 15.2 0.0 47.6 62.7  pRDI 9.9 14.9 N/A 8.9 10.4  pAHI 7.6 11.2 N/A 5.3 6.8  ODI 2.3 7.5 N/A 1.5 2.9     Snoring Statistics Snoring Level (dB) >40 >50 >60 >70 >80 >Threshold (45)  Sleep (min) 238.2 5.3 1.6 0.0 0.0 9.3  Sleep % 55.6 1.2 0.4 0.0 0.0 2.2    Mean: 41 dB Sleep Stages Chart

## 2019-09-13 ENCOUNTER — Telehealth: Payer: Self-pay

## 2019-09-13 NOTE — Telephone Encounter (Signed)
-----   Message from Huston Foley, MD sent at 09/12/2019  8:00 AM EST ----- Patient referred by Dr. Thad Ranger with orthodontics, seen by me on 08/23/19, HST on 09/10/19.    Please call and notify the patient that the recent home sleep test showed obstructive sleep apnea. OSA is overall mild, but would be worth treating to see if she feels better after treatment. She is encouraged to discuss an oral appliance option with Dr. Thad Ranger. If she would like to pursue treatment with autoPAP, I would be happy to prescribe an autoPAP machine for home use, through a DME company (of her choice, or as per insurance requirement). The DME representative will educate her on how to use the machine, how to put the mask on, etc. I have not put an order in yet, please let me know, how  she would like to proceed. Alternative treatment could be weight loss and avoiding the supine sleep position or a dental device, like I said. Again, let me know.   Huston Foley, MD, PhD [facility])

## 2019-09-13 NOTE — Telephone Encounter (Signed)
I attempted to reach the pt. I had difficulty leaving a vm on her mail box. Will try again at a later time.

## 2019-09-19 NOTE — Telephone Encounter (Signed)
I reached out to the pt and we discussed the sleep study. Pt is agreeable to speaking with Dr. Thad Ranger first and then will make her decision about using either the dental device or the autopap. Pt advised I would wait to hear from her on how she would like to proceed.

## 2020-08-22 ENCOUNTER — Other Ambulatory Visit: Payer: Self-pay | Admitting: Obstetrics and Gynecology

## 2020-08-22 DIAGNOSIS — R5381 Other malaise: Secondary | ICD-10-CM

## 2020-10-21 ENCOUNTER — Other Ambulatory Visit: Payer: Self-pay | Admitting: Obstetrics and Gynecology

## 2020-10-21 DIAGNOSIS — E2839 Other primary ovarian failure: Secondary | ICD-10-CM

## 2021-04-09 ENCOUNTER — Ambulatory Visit
Admission: RE | Admit: 2021-04-09 | Discharge: 2021-04-09 | Disposition: A | Discharge status: Home / Self Care | Payer: Managed Care, Other (non HMO) | Source: Ambulatory Visit | Attending: Obstetrics and Gynecology | Admitting: Obstetrics and Gynecology

## 2021-04-09 ENCOUNTER — Other Ambulatory Visit: Payer: Self-pay

## 2021-04-09 DIAGNOSIS — E2839 Other primary ovarian failure: Secondary | ICD-10-CM

## 2023-04-11 ENCOUNTER — Other Ambulatory Visit: Payer: Self-pay | Admitting: Obstetrics and Gynecology

## 2023-04-11 ENCOUNTER — Other Ambulatory Visit: Payer: Managed Care, Other (non HMO)

## 2023-04-11 DIAGNOSIS — E2839 Other primary ovarian failure: Secondary | ICD-10-CM

## 2023-04-12 ENCOUNTER — Other Ambulatory Visit: Payer: Self-pay

## 2023-04-12 ENCOUNTER — Ambulatory Visit: Payer: Managed Care, Other (non HMO)

## 2023-04-12 ENCOUNTER — Ambulatory Visit
Admission: EM | Admit: 2023-04-12 | Discharge: 2023-04-12 | Disposition: A | Discharge status: Home / Self Care | Payer: Managed Care, Other (non HMO) | Attending: Internal Medicine | Admitting: Internal Medicine

## 2023-04-12 ENCOUNTER — Encounter: Payer: Self-pay | Admitting: Emergency Medicine

## 2023-04-12 ENCOUNTER — Telehealth: Payer: Self-pay | Admitting: Internal Medicine

## 2023-04-12 DIAGNOSIS — R1032 Left lower quadrant pain: Secondary | ICD-10-CM

## 2023-04-12 LAB — POCT URINALYSIS DIP (MANUAL ENTRY)
Bilirubin, UA: NEGATIVE
Glucose, UA: NEGATIVE mg/dL
Leukocytes, UA: NEGATIVE
Nitrite, UA: NEGATIVE
Spec Grav, UA: 1.025 (ref 1.010–1.025)
Urobilinogen, UA: 0.2 U/dL
pH, UA: 6 (ref 5.0–8.0)

## 2023-04-12 NOTE — ED Provider Notes (Signed)
[facility]    CSN: [id number] Arrival date & time: 04/12/23  0843      History   Chief Complaint Chief Complaint  Patient presents with   Abdominal Pain    HPI Mackenzie Frost is a 57 y.o. female.   Patient presents today with left lower abdominal pain that started this morning around 7:30 AM suddenly.  Patient describes it as a cramping pain that is constant but has improved in severity since it started.  Denies nausea, vomiting, diarrhea.  Last bowel movement was this morning and patient denies blood in stool.  Patient reports normal bowel movements recently.  Denies fever, body aches, chills.  Denies dysuria, hematuria, urinary frequency, vaginal discharge.  Patient is sexually active but denies exposure to STD.  Patient last had a menstrual cycle approximately 5 years ago which is baseline for her.  Denies any history of chronic gastrointestinal problems or kidney stone. She has taken advil with some improvement in pain.    Abdominal Pain   Past Medical History:  Diagnosis Date   Atypical chest pain    Hypertension    Palpitations     Patient Active Problem List   Diagnosis Date Noted   Helicobacter pylori gastritis 12/01/2018   Acute LUQ pain 08/16/2018   Constipation 08/16/2018   Elevated LFTs 08/16/2018   Family history of pancreatic cancer 08/16/2018   Non-allergic rhinitis 07/26/2018   History of anemia 02/15/2018   Vitamin D deficiency 02/15/2018   Chest pain 07/30/2015   Palpitations 07/30/2015   Essential hypertension 07/30/2015    Past Surgical History:  Procedure Laterality Date   CESAREAN SECTION     x2    OB History   No obstetric history on file.      Home Medications    Prior to Admission medications   Medication Sig Start Date End Date Taking? Authorizing Provider  Cholecalciferol (VITAMIN D3) 50 MCG (2000 UT) capsule Take by mouth.    [provider]  lisinopril (ZESTRIL) 20 MG tablet Take by mouth. 11/22/18    [provider]    Family History Family History  Problem Relation Age of Onset   Cancer Mother    Diabetes Mother    Hypertension Father    Heart failure Father     Social History Social History   Tobacco Use   Smoking status: Some Days   Smokeless tobacco: Never  Vaping Use   Vaping status: Never Used  Substance Use Topics   Alcohol use: Yes    Comment: 3-4/ month    Drug use: No     Allergies   Sulfate   Review of Systems Review of Systems Per HPI  Physical Exam Triage Vital Signs ED Triage Vitals  Encounter Vitals Group     BP 04/12/23 1048 136/77     Systolic BP Percentile --      Diastolic BP Percentile --      Pulse Rate 04/12/23 1048 66     Resp 04/12/23 1048 18     Temp 04/12/23 1048 98.3 F (36.8 C)     Temp Source 04/12/23 1048 Oral     SpO2 04/12/23 1048 94 %     Weight --      Height --      Head Circumference --      Peak Flow --      Pain Score 04/12/23 1049 4     Pain Loc --      Pain Education --  Exclude from Growth Chart --    No data found.  Updated Vital Signs BP 136/77 (BP Location: Left Arm)   Pulse 66   Temp 98.3 F (36.8 C) (Oral)   Resp 18   SpO2 94%   Visual Acuity Right Eye Distance:   Left Eye Distance:   Bilateral Distance:    Right Eye Near:   Left Eye Near:    Bilateral Near:     Physical Exam Constitutional:      General: She is not in acute distress.    Appearance: Normal appearance. She is not toxic-appearing or diaphoretic.  HENT:     Head: Normocephalic and atraumatic.  Eyes:     Extraocular Movements: Extraocular movements intact.     Conjunctiva/sclera: Conjunctivae normal.  Cardiovascular:     Rate and Rhythm: Normal rate and regular rhythm.     Pulses: Normal pulses.     Heart sounds: Normal heart sounds.  Pulmonary:     Effort: Pulmonary effort is normal. No respiratory distress.     Breath sounds: Normal breath sounds.  Abdominal:     General: Bowel sounds are normal.  There is no distension.     Palpations: Abdomen is soft.     Tenderness: There is abdominal tenderness.       Comments: Mild tenderness to palpation to left lower quadrant of abdomen.  Neurological:     General: No focal deficit present.     Mental Status: She is alert and oriented to person, place, and time. Mental status is at baseline.  Psychiatric:        Mood and Affect: Mood normal.        Behavior: Behavior normal.        Thought Content: Thought content normal.        Judgment: Judgment normal.      UC Treatments / Results  Labs (all labs ordered are listed, but only abnormal results are displayed) Labs Reviewed  POCT URINALYSIS DIP (MANUAL ENTRY) - Abnormal; Notable for the following components:      Result Value   Ketones, POC UA trace (5) (*)    Blood, UA moderate (*)    Protein Ur, POC trace (*)    All other components within normal limits  CBC  COMPREHENSIVE METABOLIC PANEL    EKG   Radiology DG Abd 2 Views  Result Date: 04/12/2023 CLINICAL DATA:  Left-sided flank pain. EXAM: ABDOMEN - 2 VIEW COMPARISON:  None Available. FINDINGS: Punctate presumed phleboliths overlie the left hemipelvis. No additional abnormal opacities overlies expected location of either renal fossa, ureter or the urinary bladder. Moderate colonic stool burden without evidence of enteric obstruction. No pneumoperitoneum, pneumatosis or portal venous gas No acute osseous abnormalities. Punctate bone island seen within the left pubis. IMPRESSION: 1. Presumed phleboliths overlie the left hemipelvis. If there is concern for a distal left ureteral stone, further evaluation with noncontrast CT scan of the abdomen pelvis could be performed as indicated. 2. Otherwise, no evidence of nephrolithiasis. 3. Moderate colonic stool burden without evidence of enteric obstruction. Electronically Signed   By: Simonne Come M.D.   On: 04/12/2023 13:23    Procedures Procedures (including critical care  time)  Medications Ordered in UC Medications - No data to display  Initial Impression / Assessment and Plan / UC Course  I have reviewed the triage vital signs and the nursing notes.  Pertinent labs & imaging results that were available during my care of the patient were reviewed  by me and considered in my medical decision making (see chart for details).     UA fairly unremarkable except for some red blood cells.  Differential diagnoses include kidney stone versus diverticulitis versus constipation versus gastroenteritis.  Discussed with patient limited evaluation resources here in urgent care.  There are no obvious signs of acute abdomen on exam so do not think that emergent evaluation or CT imaging is necessary.  Patient is sitting upright in chair in no acute distress which is also reassuring.  Abdominal x-ray completed that showed phleboliths but no definitive ureteral stone. I am suspicious that kidney stone could be cause of pain and simply has to pass. Attempted to call patient to notify her of results but there was no answer. Left voicemail for a call back per privacy policy. Will obtain CMP and CBC.  Awaiting results.  Advised patient of strict ER precautions prior to discharge if symptoms persist or worsen and pain management in the meantime.  Advised PCP follow-up.  Patient verbalized understanding and was agreeable with plan. Final Clinical Impressions(s) / UC Diagnoses   Final diagnoses:  Abdominal pain, left lower quadrant     Discharge Instructions      I will call with x-ray results.  Blood work is pending.  Please go to the emergency department if symptoms persist or worsen.     ED Prescriptions   None    PDMP not reviewed this encounter.   Gustavus Bryant, [place] 04/12/23 [phone]

## 2023-04-12 NOTE — ED Triage Notes (Signed)
Pt sts pain in left flank area starting today; denies other sx; pt sts some HA

## 2023-04-12 NOTE — Telephone Encounter (Signed)
Patient called back.  Discussed x-ray results with her.  Discussed possibility of ureteral stone versus phlebolith on x-ray but nothing definitive.  Discussed strict ER precautions if pain persists or worsens.  Patient verbalized understanding and was agreeable with plan. All questions answered.

## 2023-04-12 NOTE — Discharge Instructions (Signed)
I will call with x-ray results.  Blood work is pending.  Please go to the emergency department if symptoms persist or worsen.

## 2023-04-13 ENCOUNTER — Ambulatory Visit
Admission: RE | Admit: 2023-04-13 | Discharge: 2023-04-13 | Discharge status: Home / Self Care | Payer: Managed Care, Other (non HMO) | Source: Ambulatory Visit | Attending: Obstetrics and Gynecology | Admitting: Obstetrics and Gynecology

## 2023-04-13 DIAGNOSIS — E2839 Other primary ovarian failure: Secondary | ICD-10-CM
